# Patient Record
Sex: Male | Born: 2000
Health system: Southern US, Community
[De-identification: ages and names within clinical notes are randomized; demographics above are authoritative.]

## PROBLEM LIST (undated history)

## (undated) DIAGNOSIS — E669 Obesity, unspecified: Secondary | ICD-10-CM

## (undated) DIAGNOSIS — L509 Urticaria, unspecified: Secondary | ICD-10-CM

## (undated) HISTORY — DX: Urticaria, unspecified: L50.9

## (undated) HISTORY — DX: Obesity, unspecified: E66.9

---
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
## 2022-01-05 DIAGNOSIS — F411 Generalized anxiety disorder: Secondary | ICD-10-CM | POA: Diagnosis not present

## 2022-01-12 ENCOUNTER — Ambulatory Visit (INDEPENDENT_AMBULATORY_CARE_PROVIDER_SITE_OTHER): Payer: BC Managed Care – PPO

## 2022-01-12 ENCOUNTER — Encounter: Payer: Self-pay | Admitting: Orthopaedic Surgery

## 2022-01-12 ENCOUNTER — Other Ambulatory Visit: Payer: Self-pay

## 2022-01-12 ENCOUNTER — Ambulatory Visit (INDEPENDENT_AMBULATORY_CARE_PROVIDER_SITE_OTHER): Payer: BC Managed Care – PPO | Admitting: Orthopaedic Surgery

## 2022-01-12 DIAGNOSIS — G8929 Other chronic pain: Secondary | ICD-10-CM

## 2022-01-12 DIAGNOSIS — M542 Cervicalgia: Secondary | ICD-10-CM

## 2022-01-12 DIAGNOSIS — F411 Generalized anxiety disorder: Secondary | ICD-10-CM | POA: Diagnosis not present

## 2022-01-12 DIAGNOSIS — M25511 Pain in right shoulder: Secondary | ICD-10-CM | POA: Diagnosis not present

## 2022-01-12 NOTE — Progress Notes (Signed)
? ?Office Visit Note ?  ?Patient: Joel Holmes           ?Date of Birth: [birth year]           ?MRN: [id number] ?Visit Date: 01/12/2022 ?             ?Requested by: Estrella Myrtle, MD ?[address] ?[place],  [place] [address] ?PCP: Estrella Myrtle, MD ? ? ?Assessment & Plan: ?Visit Diagnoses:  ?1. Chronic right shoulder pain   ?2. Neck pain   ? ? ?Plan: Impression is chronic right shoulder pain.  He has done physical therapy for about 2 to 3 years without significant relief therefore he will need an MR arthrogram to rule out structural normalities.  Follow-up after the MRI. ? ?Follow-Up Instructions: No follow-ups on file.  ? ?Orders:  ?Orders Placed This Encounter  ?Procedures  ? XR Shoulder Right  ? XR Cervical Spine 2 or 3 views  ? MR SHOULDER RIGHT W CONTRAST  ? Arthrogram  ? ?No orders of the defined types were placed in this encounter. ? ? ? ? Procedures: ?No procedures performed ? ? ?Clinical Data: ?No additional findings. ? ? ?Subjective: ?Chief Complaint  ?Patient presents with  ? Right Shoulder - Pain  ? Neck - Pain  ? ? ?HPI ? 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  He comes in for chronic right shoulder pain.  Denies any injuries.  He has pain when working out at [employer]  Overall well-maintained range of motion.  Takes Advil and Tylenol occasionally. ? ?Review of Systems  ?Constitutional: Negative.   ?All other systems reviewed and are negative. ? ? ?Objective: ?Vital Signs: There were no vitals taken for this visit. ? ?Physical Exam ?Vitals and nursing note reviewed.  ?Constitutional:   ?   Appearance: He is well-developed.  ?HENT:  ?   Head: Normocephalic and atraumatic.  ?Eyes:  ?   Pupils: Pupils are equal, round, and reactive to light.  ?Pulmonary:  ?   Effort: Pulmonary effort is normal.  ?Abdominal:  ?   Palpations: Abdomen is soft.  ?Musculoskeletal:     ?   General: Normal range of motion.  ?   Cervical back:  Neck supple.  ?Skin: ?   General: Skin is warm.  ?Neurological:  ?   Mental Status: He is alert and oriented to person, place, and time.  ?Psychiatric:     ?   Behavior: Behavior normal.     ?   Thought Content: Thought content normal.     ?   Judgment: Judgment normal.  ? ? ?Ortho Exam ? ?Examination of right shoulder shows full range of motion.  Pain with loading of the posterior labrum.  Rotator cuff strength is normal.  No pain with impingement testing.  AC joint nontender. ? ?Specialty Comments:  ?No specialty comments available. ? ?Imaging: ?XR Cervical Spine 2 or 3 views ? ?Result Date: 01/12/2022 ?Straightening of cervical spine.  No acute or structural abnormalities. ? ?XR Shoulder Right ? ?Result Date: 01/12/2022 ?No acute or structural abnormalities  ? ? ?PMFS History: ?There are no problems to display for this patient. ? ?Past Medical History:  ?Diagnosis Date  ? Obesity   ? Urticaria   ?  ?Family History  ?Problem Relation Age of Onset  ? Asthma Mother   ? Allergic rhinitis Mother   ? Cancer Maternal Grandmother   ?  neuro endocrine carcinoma  ? Allergic rhinitis Maternal Grandfather   ? Urticaria Maternal Grandfather   ?  ?History reviewed. No pertinent surgical history. ?Social History  ? ?Occupational History  ? Not on file  ?Tobacco Use  ? Smoking status: Never  ? Smokeless tobacco: Never  ?Substance and Sexual Activity  ? Alcohol use: Not on file  ? Drug use: Not on file  ? Sexual activity: Not on file  ? ? ? ? ? ? ?

## 2022-01-19 DIAGNOSIS — F411 Generalized anxiety disorder: Secondary | ICD-10-CM | POA: Diagnosis not present

## 2022-01-20 ENCOUNTER — Other Ambulatory Visit: Payer: BC Managed Care – PPO

## 2022-01-26 ENCOUNTER — Telehealth: Payer: Self-pay | Admitting: Orthopaedic Surgery

## 2022-01-26 DIAGNOSIS — F411 Generalized anxiety disorder: Secondary | ICD-10-CM | POA: Diagnosis not present

## 2022-01-26 NOTE — Telephone Encounter (Signed)
LMOM for pt to sch post MRI after 02/02/22 with XU ?

## 2022-02-02 ENCOUNTER — Ambulatory Visit
Admission: RE | Admit: 2022-02-02 | Discharge: 2022-02-02 | Disposition: A | Discharge status: Home / Self Care | Payer: BC Managed Care – PPO | Source: Ambulatory Visit | Attending: Orthopaedic Surgery | Admitting: Orthopaedic Surgery

## 2022-02-02 DIAGNOSIS — G8929 Other chronic pain: Secondary | ICD-10-CM

## 2022-02-02 DIAGNOSIS — M25511 Pain in right shoulder: Secondary | ICD-10-CM | POA: Diagnosis not present

## 2022-02-02 DIAGNOSIS — F411 Generalized anxiety disorder: Secondary | ICD-10-CM | POA: Diagnosis not present

## 2022-02-02 MED ORDER — IOPAMIDOL (ISOVUE-M 200) INJECTION 41%: 15.0000 mL | Freq: Once | INTRAMUSCULAR | Status: DC

## 2022-02-02 NOTE — Progress Notes (Signed)
Needs appt

## 2022-02-08 NOTE — Progress Notes (Signed)
Called no answer LMOM.

## 2022-02-09 DIAGNOSIS — F411 Generalized anxiety disorder: Secondary | ICD-10-CM | POA: Diagnosis not present

## 2022-02-09 NOTE — Progress Notes (Signed)
Sent a Clinical cytogeneticist message advising him to schedule appt to review MRI ?

## 2022-02-16 DIAGNOSIS — F411 Generalized anxiety disorder: Secondary | ICD-10-CM | POA: Diagnosis not present

## 2022-02-23 DIAGNOSIS — F411 Generalized anxiety disorder: Secondary | ICD-10-CM | POA: Diagnosis not present

## 2022-03-09 ENCOUNTER — Ambulatory Visit (INDEPENDENT_AMBULATORY_CARE_PROVIDER_SITE_OTHER): Payer: BC Managed Care – PPO | Admitting: Orthopaedic Surgery

## 2022-03-09 ENCOUNTER — Encounter: Payer: Self-pay | Admitting: Orthopaedic Surgery

## 2022-03-09 DIAGNOSIS — F411 Generalized anxiety disorder: Secondary | ICD-10-CM | POA: Diagnosis not present

## 2022-03-09 DIAGNOSIS — G8929 Other chronic pain: Secondary | ICD-10-CM | POA: Diagnosis not present

## 2022-03-09 DIAGNOSIS — M25511 Pain in right shoulder: Secondary | ICD-10-CM | POA: Diagnosis not present

## 2022-03-09 NOTE — Progress Notes (Signed)
? ?  Office Visit Note ?  ?Patient: Joel Holmes           ?Date of Birth: 2000/11/30           ?MRN: [id number] ?Visit Date: 03/09/2022 ?             ?Requested by: Estrella Myrtle, MD ?[address] ?[place],  [place] [address] ?PCP: Estrella Myrtle, MD ? ? ?Assessment & Plan: ?Visit Diagnoses:  ?1. Chronic right shoulder pain   ? ? ?Plan: Based on findings I feel that his issues stem from his scapula snapping on the posterior rib cage.  I do not feel that he is experiencing frank dislocations of the glenohumeral joint.  His MR arthrogram is fairly reassuring and is entirely negative.  I would like to send him to outpatient PT for scapular dyskinesia.  If he does not see any improvement from PT may send him to Dr. August Saucer or Steward Drone for second opinion. ? ?Follow-Up Instructions: Return if symptoms worsen or fail to improve.  ? ?Orders:  ?No orders of the defined types were placed in this encounter. ? ?No orders of the defined types were placed in this encounter. ? ? ? ? Procedures: ?No procedures performed ? ? ?Clinical Data: ?No additional findings. ? ? ?Subjective: ?Chief Complaint  ?Patient presents with  ? Right Shoulder - Pain, Follow-up  ? ? ?HPI ? ?Joel Holmes returns today with his mother to discuss right shoulder MRI.  No changes in his symptoms. ? ?Review of Systems ? ? ?Objective: ?Vital Signs: There were no vitals taken for this visit. ? ?Physical Exam ? ?Ortho Exam ? ?Examination of right shoulder shows full active and passive range of motion of the glenohumeral joint.  Negative apprehension.  Strength is intact to manual muscle testing.  Negative sulcus sign.  Able to feel snapping of his scapula on the posterior rib cage. ? ?Specialty Comments:  ?No specialty comments available. ? ?Imaging: ?No results found. ? ? ?PMFS History: ?Patient Active Problem List  ? Diagnosis Date Noted  ? Chronic right shoulder pain 03/09/2022  ? ?Past Medical History:  ?Diagnosis Date  ? Obesity   ? Urticaria   ?  ?Family History   ?Problem Relation Age of Onset  ? Asthma Mother   ? Allergic rhinitis Mother   ? Cancer Maternal Grandmother   ?     neuro endocrine carcinoma  ? Allergic rhinitis Maternal Grandfather   ? Urticaria Maternal Grandfather   ?  ?History reviewed. No pertinent surgical history. ?Social History  ? ?Occupational History  ? Not on file  ?Tobacco Use  ? Smoking status: Never  ? Smokeless tobacco: Never  ?Substance and Sexual Activity  ? Alcohol use: Not on file  ? Drug use: Not on file  ? Sexual activity: Not on file  ? ? ? ? ? ? ?

## 2022-03-16 DIAGNOSIS — F411 Generalized anxiety disorder: Secondary | ICD-10-CM | POA: Diagnosis not present

## 2022-03-30 DIAGNOSIS — F411 Generalized anxiety disorder: Secondary | ICD-10-CM | POA: Diagnosis not present

## 2022-04-06 DIAGNOSIS — F411 Generalized anxiety disorder: Secondary | ICD-10-CM | POA: Diagnosis not present

## 2022-04-13 DIAGNOSIS — F411 Generalized anxiety disorder: Secondary | ICD-10-CM | POA: Diagnosis not present

## 2022-04-20 DIAGNOSIS — F411 Generalized anxiety disorder: Secondary | ICD-10-CM | POA: Diagnosis not present

## 2022-04-28 DIAGNOSIS — F411 Generalized anxiety disorder: Secondary | ICD-10-CM | POA: Diagnosis not present

## 2022-05-12 DIAGNOSIS — F411 Generalized anxiety disorder: Secondary | ICD-10-CM | POA: Diagnosis not present

## 2022-05-19 DIAGNOSIS — F411 Generalized anxiety disorder: Secondary | ICD-10-CM | POA: Diagnosis not present

## 2022-05-26 DIAGNOSIS — F411 Generalized anxiety disorder: Secondary | ICD-10-CM | POA: Diagnosis not present

## 2022-06-01 DIAGNOSIS — F411 Generalized anxiety disorder: Secondary | ICD-10-CM | POA: Diagnosis not present

## 2022-06-07 ENCOUNTER — Ambulatory Visit (INDEPENDENT_AMBULATORY_CARE_PROVIDER_SITE_OTHER): Payer: BC Managed Care – PPO | Admitting: Adult Health

## 2022-06-07 ENCOUNTER — Encounter: Payer: Self-pay | Admitting: Adult Health

## 2022-06-07 DIAGNOSIS — F331 Major depressive disorder, recurrent, moderate: Secondary | ICD-10-CM

## 2022-06-07 DIAGNOSIS — F428 Other obsessive-compulsive disorder: Secondary | ICD-10-CM

## 2022-06-07 DIAGNOSIS — F411 Generalized anxiety disorder: Secondary | ICD-10-CM | POA: Diagnosis not present

## 2022-06-07 NOTE — Progress Notes (Signed)
BRACY PEPPER [id number] Dec 27, 2000 21 y.o.  Subjective:   Patient ID:  Joel Holmes is a 21 y.o. (DOB 2000-11-05) male.  Chief Complaint: No chief complaint on file.   HPI Joel Holmes presents to the office today for follow-up of MDD, GAD, Obsessional thoughts.   Describes mood today as "ok". Pleasant. Less tearfulness. Mood symptoms - reports some depression and anxiety - easier to work through. Feels irritable - "not outwardly".  Decreased worry and over thinking. Stating "I'm managing things". Has taken the Celexa sporadically and has now stopped. Stable interest and motivation. Seeing therapist. Taking medications as prescribed. Energy levels stable. Active, has a regular exercise routine. Enjoys some usual interests and activities. Student. Dating - has a girlfriend. Lives with parents. Brother in [place]. Spending time with family. Appetite adequate. Weight gain - 255 pounds. Sleeps well most nights. Averages 7 to 9 hours.  Focus and concentration stable. Completing tasks. Managing aspects of household.  Working part time. Attends [employer] - rising Sophomore. Denies SI or HI.  Denies AH or VH. Denies self harm. Reports THC use.  Seeing therapist.  Previous medication trials: Denies   Review of Systems:  Review of Systems  Musculoskeletal:  Negative for gait problem.  Neurological:  Negative for tremors.  Psychiatric/Behavioral:         Please refer to HPI    Medications: I have reviewed the patient's current medications.  Current Outpatient Medications  Medication Sig Dispense Refill   cetirizine (ZYRTEC) 10 MG tablet Take 10 mg by mouth daily.     citalopram (CELEXA) 10 MG tablet Take 1 tablet (10 mg total) by mouth daily. 90 tablet 3   DiphenhydrAMINE HCl (BENADRYL ALLERGY PO) Take 1 tablet by mouth as needed.     EPINEPHrine (EPIPEN 2-PAK) 0.3 mg/0.3 mL IJ SOAJ injection Use as directed for life-threatening allergic reaction. 4 Device 1   fexofenadine (ALLEGRA) 30  MG tablet Take 30 mg by mouth 2 (two) times daily.     fluticasone (FLONASE) 50 MCG/ACT nasal spray Place 1 spray into the nose. Use one spray in each nostril one to two times daily.     No current facility-administered medications for this visit.    Medication Side Effects: None  Allergies:  Allergies  Allergen Reactions   Amoxicillin Hives    Severity unknown    Past Medical History:  Diagnosis Date   Obesity    Urticaria     Past Medical History, Surgical history, Social history, and Family history were reviewed and updated as appropriate.   Please see review of systems for further details on the patient's review from today.   Objective:   Physical Exam:  There were no vitals taken for this visit.  Physical Exam Constitutional:      General: He is not in acute distress. Musculoskeletal:        General: No deformity.  Neurological:     Mental Status: He is alert and oriented to person, place, and time.     Coordination: Coordination normal.  Psychiatric:        Attention and Perception: Attention and perception normal. He does not perceive auditory or visual hallucinations.        Mood and Affect: Mood normal. Mood is not anxious or depressed. Affect is not labile, blunt, angry or inappropriate.        Speech: Speech normal.        Behavior: Behavior normal.  Thought Content: Thought content normal. Thought content is not paranoid or delusional. Thought content does not include homicidal or suicidal ideation. Thought content does not include homicidal or suicidal plan.        Cognition and Memory: Cognition and memory normal.        Judgment: Judgment normal.     Comments: Insight intact     Lab Review:  No results found for: "NA", "K", "CL", "CO2", "GLUCOSE", "BUN", "CREATININE", "CALCIUM", "PROT", "ALBUMIN", "AST", "ALT", "ALKPHOS", "BILITOT", "GFRNONAA", "GFRAA"  No results found for: "WBC", "RBC", "HGB", "HCT", "PLT", "MCV", "MCH", "MCHC", "RDW",  "LYMPHSABS", "MONOABS", "EOSABS", "BASOSABS"  No results found for: "POCLITH", "LITHIUM"   No results found for: "PHENYTOIN", "PHENOBARB", "VALPROATE", "CBMZ"   .res Assessment: Plan:    Plan:  PDMP reviewed  D/C Celexa 10mg  daily - patient not taking consistently.   RTC 4 weeks  Patient advised to contact office with any questions, adverse effects, or acute worsening in signs and symptoms. There are no diagnoses linked to this encounter.   Please see After Visit Summary for patient specific instructions.  No future appointments.  No orders of the defined types were placed in this encounter.   -------------------------------

## 2022-06-08 DIAGNOSIS — F411 Generalized anxiety disorder: Secondary | ICD-10-CM | POA: Diagnosis not present

## 2022-06-08 IMAGING — XA DG FLUORO GUIDE NDL PLC/BX
2 series · 2 of 2 positions shown · IV contrast (multihance)
Comparison: none

CLINICAL DATA: Chronic right shoulder pain.

EXAM:
RIGHT SHOULDER INJECTION UNDER FLUOROSCOPY
TECHNIQUE: An appropriate skin entrance site was determined. The site was
marked, prepped with Betadine, draped in the usual sterile fashion,
and infiltrated locally with 1% lidocaine. A 22 gauge spinal needle
was advanced to the superomedial margin of the humeral head under
intermittent fluoroscopy. 1 mL of 1% lidocaine injected easily. A
mixture of 0.1 mL of MultiHance, 15 mL of Isovue-M 200, and 5 mL of
sterile saline was then used to opacify the right shoulder capsule.
A total of 12 mL of this mixture was injected. No immediate
complication.
FLUOROSCOPY:
Radiation Exposure Index (as provided by the fluoroscopic device):
0.30 mGy Kerma

[Series 1: ortho standard · 1 of 1 slices shown (1 of 2)]
[im 1/1]
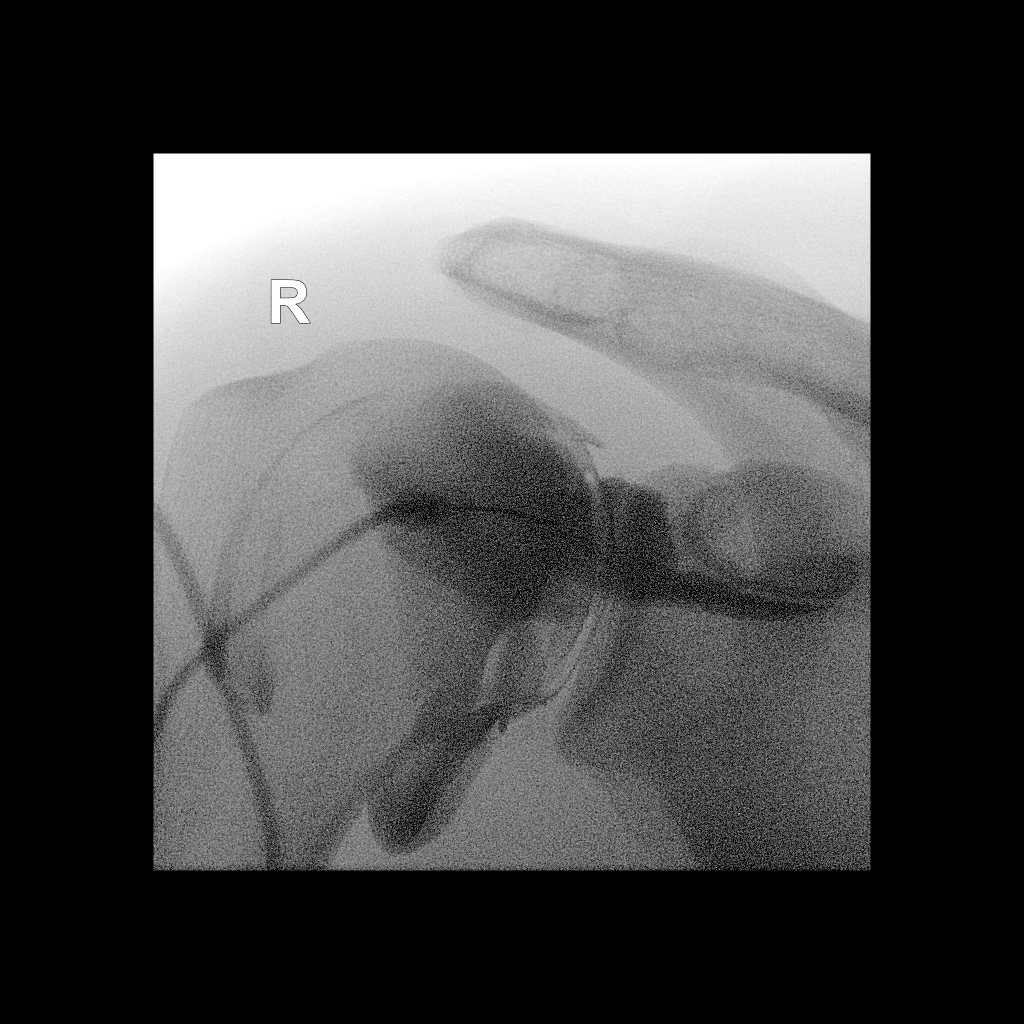

[Series 2: ortho standard · 1 of 1 slices shown (2 of 2)]
[im 1/1]
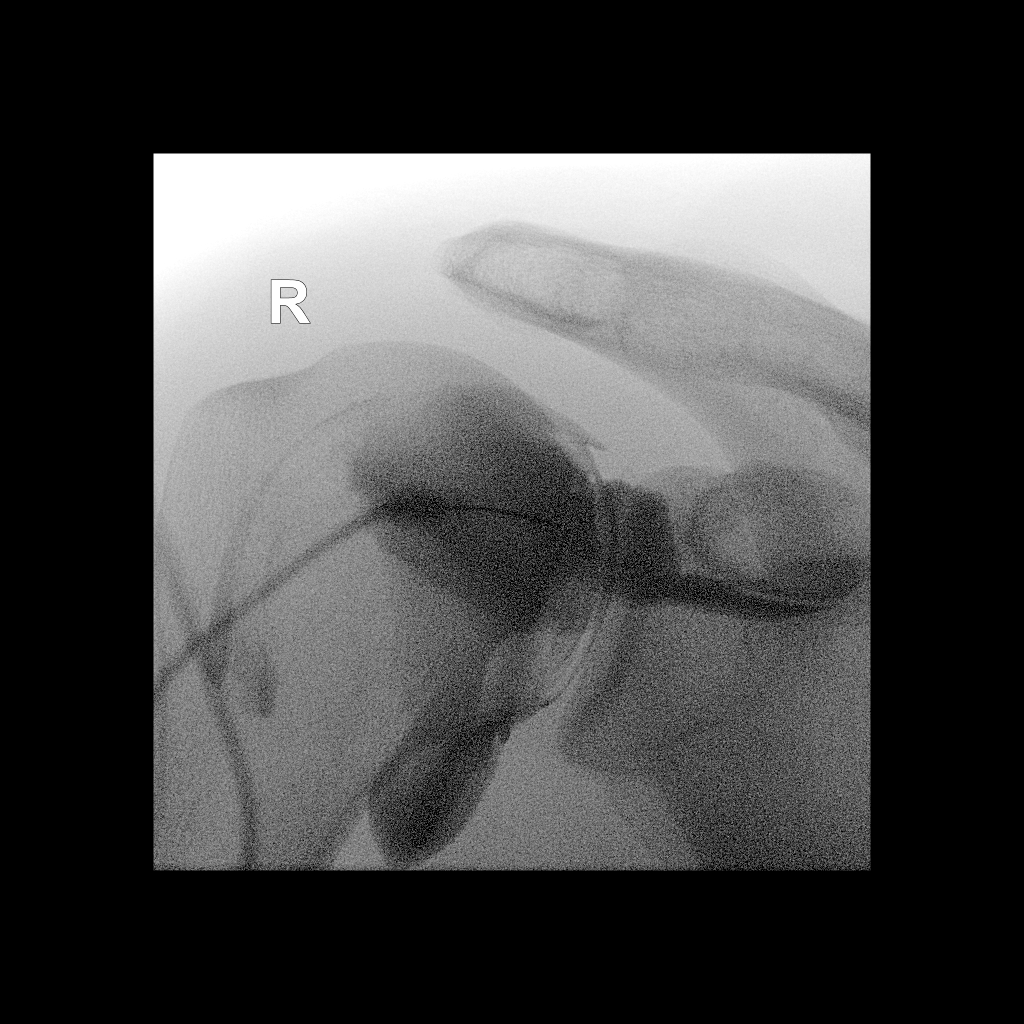

[2 of 2 positions shown; findings below may reference images not displayed]

IMPRESSION: Technically successful right shoulder injection for MRI.

## 2022-06-15 DIAGNOSIS — F411 Generalized anxiety disorder: Secondary | ICD-10-CM | POA: Diagnosis not present

## 2022-06-29 DIAGNOSIS — F411 Generalized anxiety disorder: Secondary | ICD-10-CM | POA: Diagnosis not present

## 2022-07-06 ENCOUNTER — Ambulatory Visit: Payer: BC Managed Care – PPO | Admitting: Adult Health

## 2022-07-06 DIAGNOSIS — F411 Generalized anxiety disorder: Secondary | ICD-10-CM | POA: Diagnosis not present

## 2022-07-13 DIAGNOSIS — F411 Generalized anxiety disorder: Secondary | ICD-10-CM | POA: Diagnosis not present

## 2022-07-20 DIAGNOSIS — F411 Generalized anxiety disorder: Secondary | ICD-10-CM | POA: Diagnosis not present

## 2022-07-27 DIAGNOSIS — F411 Generalized anxiety disorder: Secondary | ICD-10-CM | POA: Diagnosis not present

## 2022-08-09 ENCOUNTER — Telehealth: Payer: Self-pay | Admitting: Adult Health

## 2022-08-09 DIAGNOSIS — F411 Generalized anxiety disorder: Secondary | ICD-10-CM

## 2022-08-09 NOTE — Telephone Encounter (Signed)
Patient's mom Sharyn Lull lvm at 11:38 stating that Joel Holmes was seen about a month ago. He takes Citalopram but at last appt decided to stop taking and told to check in a month. He would like to start taking again. Mom wanted to know if he needed to make an appt for this as he will be home from school today and tomorrow. Last seen 8/7. Pls rtc is needed [phone]. Pharmacy [facility] [place]

## 2022-08-09 NOTE — Telephone Encounter (Signed)
Patient D/C's citalopram, had not been taking it consistently. Mom lm that he would like to restart it. Ok to send Rx?

## 2022-08-09 NOTE — Telephone Encounter (Signed)
Call and confirm with patient - ok to restart if Tyaire agrees - have him taper back on.

## 2022-08-09 NOTE — Telephone Encounter (Signed)
Patient said he had more stressors now and he needed some help in trying to deal with them is why he wanted to restart citalopram. Stressed to him that he needs to be consistent with taking it and he said he would.

## 2022-08-09 NOTE — Telephone Encounter (Signed)
Has an appt with Joel Holmes tomorrow. Will defer Rx until that visit.

## 2022-08-10 ENCOUNTER — Encounter: Payer: Self-pay | Admitting: Adult Health

## 2022-08-10 ENCOUNTER — Ambulatory Visit: Payer: BC Managed Care – PPO | Admitting: Adult Health

## 2022-08-10 DIAGNOSIS — F428 Other obsessive-compulsive disorder: Secondary | ICD-10-CM | POA: Diagnosis not present

## 2022-08-10 DIAGNOSIS — F331 Major depressive disorder, recurrent, moderate: Secondary | ICD-10-CM | POA: Diagnosis not present

## 2022-08-10 DIAGNOSIS — F411 Generalized anxiety disorder: Secondary | ICD-10-CM

## 2022-08-10 MED ORDER — CITALOPRAM HYDROBROMIDE 10 MG PO TABS: 10.0000 mg | ORAL_TABLET | Freq: Every day | ORAL | 3 refills | Status: DC

## 2022-08-10 NOTE — Progress Notes (Signed)
Joel Holmes [id number] September 03, 2001 21 y.o.  Subjective:   Patient ID:  Joel Holmes is a 21 y.o. (DOB 08-19-2001) male.  Chief Complaint: No chief complaint on file.   HPI Joel Holmes presents to the office today for follow-up of MDD, GAD, Obsessional thoughts.   Describes mood today as "ok". Pleasant. Tearful at times. Mood symptoms - reports depression, irritability and anxiety. Reports worry and rumination. Mood is variable. Stating "I'm not doing as well as I was". Stopped the Celexa approximately 2 months ago because he felt like he was doing well without it until recently. Stating "I didn't know what to expect". Would like to restart the Celexa 10mg  daily. Variable interest and motivation. Seeing therapist. Taking medications as prescribed. Energy levels variable. Active, has a regular exercise routine. Enjoys some usual interests and activities. Student. Dating - has a girlfriend. Lives with parents. Brother in [place]. Spending time with family. Appetite adequate. Weight gain - 275 pounds. Sleeps well most nights. Averages 4 to 8 hours.  Focus and concentration difficulties. Completing tasks. Managing aspects of household. Attends [employer] - Sophomore. Denies SI or HI. Reports passive thoughts since stopping the Celexa. Denies AH or VH. Denies self harm. Reports THC use - smoking less. Decreased alcohol use.  Seeing therapist.  Previous medication trials: Denies      Review of Systems:  Review of Systems  Musculoskeletal:  Negative for gait problem.  Neurological:  Negative for tremors.  Psychiatric/Behavioral:         Please refer to HPI    Medications: I have reviewed the patient's current medications.  Current Outpatient Medications  Medication Sig Dispense Refill   cetirizine (ZYRTEC) 10 MG tablet Take 10 mg by mouth daily.     citalopram (CELEXA) 10 MG tablet Take 1 tablet (10 mg total) by mouth daily. 90 tablet 3   DiphenhydrAMINE HCl (BENADRYL ALLERGY PO)  Take 1 tablet by mouth as needed.     EPINEPHrine (EPIPEN 2-PAK) 0.3 mg/0.3 mL IJ SOAJ injection Use as directed for life-threatening allergic reaction. 4 Device 1   fexofenadine (ALLEGRA) 30 MG tablet Take 30 mg by mouth 2 (two) times daily.     fluticasone (FLONASE) 50 MCG/ACT nasal spray Place 1 spray into the nose. Use one spray in each nostril one to two times daily.     No current facility-administered medications for this visit.    Medication Side Effects: None  Allergies:  Allergies  Allergen Reactions   Amoxicillin Hives    Severity unknown    Past Medical History:  Diagnosis Date   Obesity    Urticaria     Past Medical History, Surgical history, Social history, and Family history were reviewed and updated as appropriate.   Please see review of systems for further details on the patient's review from today.   Objective:   Physical Exam:  There were no vitals taken for this visit.  Physical Exam Constitutional:      General: He is not in acute distress. Musculoskeletal:        General: No deformity.  Neurological:     Mental Status: He is alert and oriented to person, place, and time.     Coordination: Coordination normal.  Psychiatric:        Attention and Perception: Attention and perception normal. He does not perceive auditory or visual hallucinations.        Mood and Affect: Mood normal. Mood is not anxious or depressed. Affect is not  labile, blunt, angry or inappropriate.        Speech: Speech normal.        Behavior: Behavior normal.        Thought Content: Thought content normal. Thought content is not paranoid or delusional. Thought content does not include homicidal or suicidal ideation. Thought content does not include homicidal or suicidal plan.        Cognition and Memory: Cognition and memory normal.        Judgment: Judgment normal.     Comments: Insight intact     Lab Review:  No results found for: "NA", "K", "CL", "CO2", "GLUCOSE", "BUN",  "CREATININE", "CALCIUM", "PROT", "ALBUMIN", "AST", "ALT", "ALKPHOS", "BILITOT", "GFRNONAA", "GFRAA"  No results found for: "WBC", "RBC", "HGB", "HCT", "PLT", "MCV", "MCH", "MCHC", "RDW", "LYMPHSABS", "MONOABS", "EOSABS", "BASOSABS"  No results found for: "POCLITH", "LITHIUM"   No results found for: "PHENYTOIN", "PHENOBARB", "VALPROATE", "CBMZ"   .res Assessment: Plan:    Plan:  PDMP reviewed  Restart Celexa 10mg  daily - patient not taking consistently.   RTC 4 weeks  Patient advised to contact office with any questions, adverse effects, or acute worsening in signs and symptoms.   Time spent with patient was 25 minutes. Greater than 50% of face to face time with patient was spent on counseling and coordination of care.   Diagnoses and all orders for this visit:  Major depressive disorder, recurrent episode, moderate (HCC)  Generalized anxiety disorder -     citalopram (CELEXA) 10 MG tablet; Take 1 tablet (10 mg total) by mouth daily.  Obsessional thoughts     Please see After Visit Summary for patient specific instructions.  No future appointments.   No orders of the defined types were placed in this encounter.   -------------------------------

## 2022-08-13 ENCOUNTER — Telehealth: Payer: Self-pay | Admitting: Adult Health

## 2022-08-13 NOTE — Telephone Encounter (Signed)
I don't see in the note if you discussed this letter or not.Please let me know if not I will call to get more info but he was seen on 10/10

## 2022-08-13 NOTE — Telephone Encounter (Signed)
Joel Holmes lvm requesting documentation and/or letter on his treatment to justify why he had to drop a class. 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  Patient last seen 10/10, with a follow up scheduled 08/31/22  Contact information [phone]

## 2022-08-13 NOTE — Telephone Encounter (Signed)
Called and spoke with patient - he will get information to [place] for the letter.

## 2022-08-13 NOTE — Telephone Encounter (Signed)
Spoke to pt's mom at 1:11p.  She is on DPR.  She said she would like to come pick up the letter.  Pls call her at [phone] when it's ready.   Next appt 10/31

## 2022-08-16 NOTE — Telephone Encounter (Signed)
Called and spoke with mother.

## 2022-08-16 NOTE — Telephone Encounter (Signed)
Patient's mother lvm for an update on the letter needed. States they have a deadline and wants to know when this will be done. Ph: [phone]

## 2022-08-16 NOTE — Telephone Encounter (Signed)
See message regarding letter allowing him to drop a class. I don't know if someone is already working on this.

## 2022-08-16 NOTE — Telephone Encounter (Signed)
FYI.Did he get the info you needed sent to you?

## 2022-08-16 NOTE — Telephone Encounter (Signed)
Pt LVM @ 1:19p.  He said he left a message earlier today, but wasn't sure if he advised Barnett Applebaum that needs the letter by Thurs.

## 2022-08-17 DIAGNOSIS — F411 Generalized anxiety disorder: Secondary | ICD-10-CM | POA: Diagnosis not present

## 2022-08-24 DIAGNOSIS — F411 Generalized anxiety disorder: Secondary | ICD-10-CM | POA: Diagnosis not present

## 2022-08-31 ENCOUNTER — Encounter: Payer: Self-pay | Admitting: Adult Health

## 2022-08-31 ENCOUNTER — Telehealth (INDEPENDENT_AMBULATORY_CARE_PROVIDER_SITE_OTHER): Payer: BC Managed Care – PPO | Admitting: Adult Health

## 2022-08-31 DIAGNOSIS — F428 Other obsessive-compulsive disorder: Secondary | ICD-10-CM

## 2022-08-31 DIAGNOSIS — F331 Major depressive disorder, recurrent, moderate: Secondary | ICD-10-CM | POA: Diagnosis not present

## 2022-08-31 DIAGNOSIS — F411 Generalized anxiety disorder: Secondary | ICD-10-CM

## 2022-08-31 NOTE — Progress Notes (Signed)
Joel Holmes [id number] 27-Sep-2001 21 y.o.  Virtual Visit via Video Note  I connected with pt @ on 08/31/22 at 10:40 AM EDT by a video enabled telemedicine application and verified that I am speaking with the correct person using two identifiers.   I discussed the limitations of evaluation and management by telemedicine and the availability of in person appointments. The patient expressed understanding and agreed to proceed.  I discussed the assessment and treatment plan with the patient. The patient was provided an opportunity to ask questions and all were answered. The patient agreed with the plan and demonstrated an understanding of the instructions.   The patient was advised to call back or seek an in-person evaluation if the symptoms worsen or if the condition fails to improve as anticipated.  I provided 10 minutes of non-face-to-face time during this encounter.  The patient was located at home.  The provider was located at [facility].   Dorothyann Gibbs, NP   Subjective:   Patient ID:  Joel Holmes is a 21 y.o. (DOB 2001/05/20) male.  Chief Complaint: No chief complaint on file.   HPI Joel Holmes presents for follow-up of MDD, GAD and Obsessional thoughts.   Describes mood today as "ok". Pleasant. Tearful at times. Mood symptoms - reports decreased depression, irritability and anxiety. Reports decreased worry and rumination. Mood is improved. Stating "I'm doing better". Has restarted the Celexa at 10mg  and feels it has helped with mood instability. Improved interest and motivation. Seeing therapist. Taking medications as prescribed. Energy levels improved. Active, has a regular exercise routine. Enjoys some usual interests and activities. Student. Dating - has a girlfriend. Lives with parents. Brother in . Spending time with family. Appetite adequate. Weight gain - 275 pounds. Sleeps well most nights. Averages 8 to 9 hours.  Focus and concentration has  improved. Completing tasks. Managing aspects of household. Attends [employer]. Denies SI or HI. Denies AH or VH. Denies self harm. Reports THC use - smoking less. Decreased alcohol use.  Seeing therapist.  Previous medication trials: Denies   Review of Systems:  Review of Systems  Musculoskeletal:  Negative for gait problem.  Neurological:  Negative for tremors.  Psychiatric/Behavioral:         Please refer to HPI    Medications: I have reviewed the patient's current medications.  Current Outpatient Medications  Medication Sig Dispense Refill   cetirizine (ZYRTEC) 10 MG tablet Take 10 mg by mouth daily.     citalopram (CELEXA) 10 MG tablet Take 1 tablet (10 mg total) by mouth daily. 90 tablet 3   DiphenhydrAMINE HCl (BENADRYL ALLERGY PO) Take 1 tablet by mouth as needed.     EPINEPHrine (EPIPEN 2-PAK) 0.3 mg/0.3 mL IJ SOAJ injection Use as directed for life-threatening allergic reaction. 4 Device 1   fexofenadine (ALLEGRA) 30 MG tablet Take 30 mg by mouth 2 (two) times daily.     fluticasone (FLONASE) 50 MCG/ACT nasal spray Place 1 spray into the nose. Use one spray in each nostril one to two times daily.     No current facility-administered medications for this visit.    Medication Side Effects: None  Allergies:  Allergies  Allergen Reactions   Amoxicillin Hives    Severity unknown    Past Medical History:  Diagnosis Date   Obesity    Urticaria     Family History  Problem Relation Age of Onset   Asthma Mother    Allergic rhinitis Mother  Cancer Maternal Grandmother        neuro endocrine carcinoma   Allergic rhinitis Maternal Grandfather    Urticaria Maternal Grandfather     Social History   Socioeconomic History   Marital status: Single    Spouse name: Not on file   Number of children: Not on file   Years of education: Not on file   Highest education level: Not on file  Occupational History   Not on file  Tobacco Use   Smoking status:  Never   Smokeless tobacco: Never  Substance and Sexual Activity   Alcohol use: Not on file   Drug use: Not on file   Sexual activity: Not on file  Other Topics Concern   Not on file  Social History Narrative   Not on file   Social Determinants of Health   Financial Resource Strain: Not on file  Food Insecurity: Not on file  Transportation Needs: Not on file  Physical Activity: Not on file  Stress: Not on file  Social Connections: Not on file  Intimate Partner Violence: Not on file    Past Medical History, Surgical history, Social history, and Family history were reviewed and updated as appropriate.   Please see review of systems for further details on the patient's review from today.   Objective:   Physical Exam:  There were no vitals taken for this visit.  Physical Exam Constitutional:      General: He is not in acute distress. Musculoskeletal:        General: No deformity.  Neurological:     Mental Status: He is alert and oriented to person, place, and time.     Coordination: Coordination normal.  Psychiatric:        Attention and Perception: Attention and perception normal. He does not perceive auditory or visual hallucinations.        Mood and Affect: Mood normal. Mood is not anxious or depressed. Affect is not labile, blunt, angry or inappropriate.        Speech: Speech normal.        Behavior: Behavior normal.        Thought Content: Thought content normal. Thought content is not paranoid or delusional. Thought content does not include homicidal or suicidal ideation. Thought content does not include homicidal or suicidal plan.        Cognition and Memory: Cognition and memory normal.        Judgment: Judgment normal.     Comments: Insight intact     Lab Review:  No results found for: "NA", "K", "CL", "CO2", "GLUCOSE", "BUN", "CREATININE", "CALCIUM", "PROT", "ALBUMIN", "AST", "ALT", "ALKPHOS", "BILITOT", "GFRNONAA", "GFRAA"  No results found for: "WBC",  "RBC", "HGB", "HCT", "PLT", "MCV", "MCH", "MCHC", "RDW", "LYMPHSABS", "MONOABS", "EOSABS", "BASOSABS"  No results found for: "POCLITH", "LITHIUM"   No results found for: "PHENYTOIN", "PHENOBARB", "VALPROATE", "CBMZ"   .res Assessment: Plan:    Plan:  PDMP reviewed  Continue Celexa 10mg  daily - patient not taking consistently.  Discussed ADD - patient to take a screener and call with results.   RTC 4 weeks  Patient advised to contact office with any questions, adverse effects, or acute worsening in signs and symptoms.  Time spent with patient was 25 minutes. Greater than 50% of face to face time with patient was spent on counseling and coordination of care.    Diagnoses and all orders for this visit:  Obsessional thoughts  Major depressive disorder, recurrent episode, moderate (HCC)  Generalized anxiety  disorder     Please see After Visit Summary for patient specific instructions.  No future appointments.  No orders of the defined types were placed in this encounter.     -------------------------------

## 2022-09-07 DIAGNOSIS — F411 Generalized anxiety disorder: Secondary | ICD-10-CM | POA: Diagnosis not present

## 2022-09-14 DIAGNOSIS — F411 Generalized anxiety disorder: Secondary | ICD-10-CM | POA: Diagnosis not present

## 2022-09-21 DIAGNOSIS — F411 Generalized anxiety disorder: Secondary | ICD-10-CM | POA: Diagnosis not present

## 2022-09-28 DIAGNOSIS — F411 Generalized anxiety disorder: Secondary | ICD-10-CM | POA: Diagnosis not present

## 2022-10-05 DIAGNOSIS — F411 Generalized anxiety disorder: Secondary | ICD-10-CM | POA: Diagnosis not present

## 2022-10-19 DIAGNOSIS — F411 Generalized anxiety disorder: Secondary | ICD-10-CM | POA: Diagnosis not present

## 2022-11-02 DIAGNOSIS — F411 Generalized anxiety disorder: Secondary | ICD-10-CM | POA: Diagnosis not present

## 2022-11-04 DIAGNOSIS — H5203 Hypermetropia, bilateral: Secondary | ICD-10-CM | POA: Diagnosis not present

## 2022-11-04 DIAGNOSIS — T1512XA Foreign body in conjunctival sac, left eye, initial encounter: Secondary | ICD-10-CM | POA: Diagnosis not present

## 2022-11-04 DIAGNOSIS — H1045 Other chronic allergic conjunctivitis: Secondary | ICD-10-CM | POA: Diagnosis not present

## 2022-11-04 DIAGNOSIS — T1511XA Foreign body in conjunctival sac, right eye, initial encounter: Secondary | ICD-10-CM | POA: Diagnosis not present

## 2022-11-09 DIAGNOSIS — F411 Generalized anxiety disorder: Secondary | ICD-10-CM | POA: Diagnosis not present

## 2022-11-15 DIAGNOSIS — F411 Generalized anxiety disorder: Secondary | ICD-10-CM | POA: Diagnosis not present

## 2022-11-22 DIAGNOSIS — F411 Generalized anxiety disorder: Secondary | ICD-10-CM | POA: Diagnosis not present

## 2022-11-30 DIAGNOSIS — F411 Generalized anxiety disorder: Secondary | ICD-10-CM | POA: Diagnosis not present

## 2022-12-06 DIAGNOSIS — F411 Generalized anxiety disorder: Secondary | ICD-10-CM | POA: Diagnosis not present

## 2022-12-16 DIAGNOSIS — F411 Generalized anxiety disorder: Secondary | ICD-10-CM | POA: Diagnosis not present

## 2022-12-20 DIAGNOSIS — F411 Generalized anxiety disorder: Secondary | ICD-10-CM | POA: Diagnosis not present

## 2022-12-27 DIAGNOSIS — Z20822 Contact with and (suspected) exposure to covid-19: Secondary | ICD-10-CM | POA: Diagnosis not present

## 2022-12-27 DIAGNOSIS — J029 Acute pharyngitis, unspecified: Secondary | ICD-10-CM | POA: Diagnosis not present

## 2022-12-31 DIAGNOSIS — F411 Generalized anxiety disorder: Secondary | ICD-10-CM | POA: Diagnosis not present

## 2023-01-06 DIAGNOSIS — F411 Generalized anxiety disorder: Secondary | ICD-10-CM | POA: Diagnosis not present

## 2023-01-12 DIAGNOSIS — R350 Frequency of micturition: Secondary | ICD-10-CM | POA: Diagnosis not present

## 2023-01-12 DIAGNOSIS — Z125 Encounter for screening for malignant neoplasm of prostate: Secondary | ICD-10-CM | POA: Diagnosis not present

## 2023-01-12 DIAGNOSIS — N3944 Nocturnal enuresis: Secondary | ICD-10-CM | POA: Diagnosis not present

## 2023-01-13 DIAGNOSIS — F411 Generalized anxiety disorder: Secondary | ICD-10-CM | POA: Diagnosis not present

## 2023-01-20 DIAGNOSIS — F411 Generalized anxiety disorder: Secondary | ICD-10-CM | POA: Diagnosis not present

## 2023-01-27 DIAGNOSIS — F411 Generalized anxiety disorder: Secondary | ICD-10-CM | POA: Diagnosis not present

## 2023-02-03 DIAGNOSIS — F411 Generalized anxiety disorder: Secondary | ICD-10-CM | POA: Diagnosis not present

## 2023-02-10 DIAGNOSIS — F411 Generalized anxiety disorder: Secondary | ICD-10-CM | POA: Diagnosis not present

## 2023-02-17 DIAGNOSIS — F411 Generalized anxiety disorder: Secondary | ICD-10-CM | POA: Diagnosis not present

## 2023-02-24 DIAGNOSIS — F411 Generalized anxiety disorder: Secondary | ICD-10-CM | POA: Diagnosis not present

## 2023-03-03 DIAGNOSIS — F411 Generalized anxiety disorder: Secondary | ICD-10-CM | POA: Diagnosis not present

## 2023-03-08 DIAGNOSIS — R351 Nocturia: Secondary | ICD-10-CM | POA: Diagnosis not present

## 2023-03-08 DIAGNOSIS — N3944 Nocturnal enuresis: Secondary | ICD-10-CM | POA: Diagnosis not present

## 2023-03-10 DIAGNOSIS — F411 Generalized anxiety disorder: Secondary | ICD-10-CM | POA: Diagnosis not present

## 2023-03-17 DIAGNOSIS — F411 Generalized anxiety disorder: Secondary | ICD-10-CM | POA: Diagnosis not present

## 2023-03-24 DIAGNOSIS — F411 Generalized anxiety disorder: Secondary | ICD-10-CM | POA: Diagnosis not present

## 2023-03-31 DIAGNOSIS — F411 Generalized anxiety disorder: Secondary | ICD-10-CM | POA: Diagnosis not present

## 2023-04-07 DIAGNOSIS — F411 Generalized anxiety disorder: Secondary | ICD-10-CM | POA: Diagnosis not present

## 2023-04-14 DIAGNOSIS — F411 Generalized anxiety disorder: Secondary | ICD-10-CM | POA: Diagnosis not present

## 2023-04-21 DIAGNOSIS — F411 Generalized anxiety disorder: Secondary | ICD-10-CM | POA: Diagnosis not present

## 2023-04-27 DIAGNOSIS — F419 Anxiety disorder, unspecified: Secondary | ICD-10-CM | POA: Diagnosis not present

## 2023-04-27 DIAGNOSIS — Z79899 Other long term (current) drug therapy: Secondary | ICD-10-CM | POA: Diagnosis not present

## 2023-04-27 DIAGNOSIS — N3944 Nocturnal enuresis: Secondary | ICD-10-CM | POA: Diagnosis not present

## 2023-04-27 DIAGNOSIS — R41840 Attention and concentration deficit: Secondary | ICD-10-CM | POA: Diagnosis not present

## 2023-04-27 DIAGNOSIS — F331 Major depressive disorder, recurrent, moderate: Secondary | ICD-10-CM | POA: Diagnosis not present

## 2023-04-28 DIAGNOSIS — F411 Generalized anxiety disorder: Secondary | ICD-10-CM | POA: Diagnosis not present

## 2023-05-10 DIAGNOSIS — G4721 Circadian rhythm sleep disorder, delayed sleep phase type: Secondary | ICD-10-CM | POA: Diagnosis not present

## 2023-05-10 DIAGNOSIS — R32 Unspecified urinary incontinence: Secondary | ICD-10-CM | POA: Diagnosis not present

## 2023-05-12 DIAGNOSIS — F411 Generalized anxiety disorder: Secondary | ICD-10-CM | POA: Diagnosis not present

## 2023-05-17 ENCOUNTER — Ambulatory Visit: Payer: BC Managed Care – PPO | Admitting: Adult Health

## 2023-05-17 ENCOUNTER — Encounter: Payer: Self-pay | Admitting: Adult Health

## 2023-05-17 DIAGNOSIS — F411 Generalized anxiety disorder: Secondary | ICD-10-CM

## 2023-05-17 DIAGNOSIS — F428 Other obsessive-compulsive disorder: Secondary | ICD-10-CM | POA: Diagnosis not present

## 2023-05-17 DIAGNOSIS — F331 Major depressive disorder, recurrent, moderate: Secondary | ICD-10-CM

## 2023-05-17 MED ORDER — CITALOPRAM HYDROBROMIDE 10 MG PO TABS
10.0000 mg | ORAL_TABLET | Freq: Every day | ORAL | 3 refills | Status: AC
Start: 2023-05-17 — End: ?

## 2023-05-17 NOTE — Progress Notes (Signed)
Joel Holmes [id number] 01/15/2001 22 y.o.  Subjective:   Patient ID:  Joel Holmes is a 22 y.o. (DOB [birth year]) male.  Chief Complaint: No chief complaint on file.   HPI KILIAN SCHWARTZ presents to the office today for follow-up of MDD, GAD and Obsessional thoughts.   Describes mood today as "ok". Pleasant. Tearful at times. Mood symptoms - reports decreased depression,  anxiety, and irritability. Reports worry and rumination - "quite a bit". Denies panic attacks. Mood is stable. Stating "I'm doing alright". Feels like the Celexa at 10mg  works well. Improved interest and motivation. Seeing therapist. Taking medications as prescribed. Energy levels "ok". Active, has a regular exercise routine. Enjoys some usual interests and activities. Student. Dating. Lives with parents. Brother in [place]. Spending time with family. Appetite adequate. Weight gain - 285 pounds. Sleeps well most nights. Averages 8 to 9 hours.  Focus and concentration has improved. Completing tasks. Managing aspects of household. 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 Denies SI or HI. Denies AH or VH. Denies self harm. Reports THC use - smoking less. Decreased alcohol use.  Seeing therapist.  Previous medication trials: Denies  Review of Systems:  Review of Systems  Musculoskeletal:  Negative for gait problem.  Neurological:  Negative for tremors.  Psychiatric/Behavioral:         Please refer to HPI    Medications: I have reviewed the patient's current medications.  Current Outpatient Medications  Medication Sig Dispense Refill   cetirizine (ZYRTEC) 10 MG tablet Take 10 mg by mouth daily.     citalopram (CELEXA) 10 MG tablet Take 1 tablet (10 mg total) by mouth daily. 90 tablet 3   DiphenhydrAMINE HCl (BENADRYL ALLERGY PO) Take 1 tablet by mouth as needed.     EPINEPHrine (EPIPEN 2-PAK) 0.3 mg/0.3 mL IJ SOAJ injection Use as directed for life-threatening allergic reaction. 4 Device 1   fexofenadine (ALLEGRA) 30 MG tablet  Take 30 mg by mouth 2 (two) times daily.     fluticasone (FLONASE) 50 MCG/ACT nasal spray Place 1 spray into the nose. Use one spray in each nostril one to two times daily.     No current facility-administered medications for this visit.    Medication Side Effects: None  Allergies:  Allergies  Allergen Reactions   Amoxicillin Hives    Severity unknown    Past Medical History:  Diagnosis Date   Obesity    Urticaria     Past Medical History, Surgical history, Social history, and Family history were reviewed and updated as appropriate.   Please see review of systems for further details on the patient's review from today.   Objective:   Physical Exam:  There were no vitals taken for this visit.  Physical Exam Constitutional:      General: He is not in acute distress. Musculoskeletal:        General: No deformity.  Neurological:     Mental Status: He is alert and oriented to person, place, and time.     Coordination: Coordination normal.  Psychiatric:        Attention and Perception: Attention and perception normal. He does not perceive auditory or visual hallucinations.        Mood and Affect: Mood normal. Mood is not anxious or depressed. Affect is not labile, blunt, angry or inappropriate.        Speech: Speech normal.        Behavior: Behavior normal.        Thought Content: Thought  content normal. Thought content is not paranoid or delusional. Thought content does not include homicidal or suicidal ideation. Thought content does not include homicidal or suicidal plan.        Cognition and Memory: Cognition and memory normal.        Judgment: Judgment normal.     Comments: Insight intact     Lab Review:  No results found for: "NA", "K", "CL", "CO2", "GLUCOSE", "BUN", "CREATININE", "CALCIUM", "PROT", "ALBUMIN", "AST", "ALT", "ALKPHOS", "BILITOT", "GFRNONAA", "GFRAA"  No results found for: "WBC", "RBC", "HGB", "HCT", "PLT", "MCV", "MCH", "MCHC", "RDW", "LYMPHSABS",  "MONOABS", "EOSABS", "BASOSABS"  No results found for: "POCLITH", "LITHIUM"   No results found for: "PHENYTOIN", "PHENOBARB", "VALPROATE", "CBMZ"   .res Assessment: Plan:    Plan:  PDMP reviewed  Continue Celexa 10mg  daily - patient working on taking consistently.  Discussed ADD - tested at [facility]  Upcoming sleep apnea testing   RTC 6 months  Patient advised to contact office with any questions, adverse effects, or acute worsening in signs and symptoms.  Time spent with patient was 15 minutes. Greater than 50% of face to face time with patient was spent on counseling and coordination of care.    There are no diagnoses linked to this encounter.   Please see After Visit Summary for patient specific instructions.  Future Appointments  Date Time Provider Department Center  05/17/2023  5:00 PM Marice Angelino, Thereasa Solo, NP [facility] None    No orders of the defined types were placed in this encounter.   -------------------------------

## 2023-05-18 DIAGNOSIS — E669 Obesity, unspecified: Secondary | ICD-10-CM | POA: Diagnosis not present

## 2023-05-18 DIAGNOSIS — G4733 Obstructive sleep apnea (adult) (pediatric): Secondary | ICD-10-CM | POA: Diagnosis not present

## 2023-05-19 DIAGNOSIS — F411 Generalized anxiety disorder: Secondary | ICD-10-CM | POA: Diagnosis not present

## 2023-05-26 DIAGNOSIS — F411 Generalized anxiety disorder: Secondary | ICD-10-CM | POA: Diagnosis not present

## 2023-06-02 DIAGNOSIS — F411 Generalized anxiety disorder: Secondary | ICD-10-CM | POA: Diagnosis not present

## 2023-07-01 DIAGNOSIS — F411 Generalized anxiety disorder: Secondary | ICD-10-CM | POA: Diagnosis not present

## 2023-07-08 DIAGNOSIS — F411 Generalized anxiety disorder: Secondary | ICD-10-CM | POA: Diagnosis not present

## 2023-07-15 DIAGNOSIS — F411 Generalized anxiety disorder: Secondary | ICD-10-CM | POA: Diagnosis not present

## 2023-08-01 ENCOUNTER — Ambulatory Visit (INDEPENDENT_AMBULATORY_CARE_PROVIDER_SITE_OTHER): Payer: Self-pay | Admitting: Adult Health

## 2023-08-01 DIAGNOSIS — Z0389 Encounter for observation for other suspected diseases and conditions ruled out: Secondary | ICD-10-CM

## 2023-08-01 NOTE — Progress Notes (Signed)
Patient no show appointment. ? ?

## 2023-08-02 ENCOUNTER — Encounter: Payer: Self-pay | Admitting: Adult Health

## 2023-08-02 ENCOUNTER — Telehealth: Payer: BC Managed Care – PPO | Admitting: Adult Health

## 2023-08-02 DIAGNOSIS — F331 Major depressive disorder, recurrent, moderate: Secondary | ICD-10-CM | POA: Diagnosis not present

## 2023-08-02 DIAGNOSIS — F428 Other obsessive-compulsive disorder: Secondary | ICD-10-CM

## 2023-08-02 DIAGNOSIS — F411 Generalized anxiety disorder: Secondary | ICD-10-CM

## 2023-08-02 NOTE — Progress Notes (Signed)
Joel Holmes [id number] 09/22/2001 22 y.o.  Virtual Visit via Video Note  I connected with pt @ on 08/02/23 at  4:40 PM EDT by a video enabled telemedicine application and verified that I am speaking with the correct person using two identifiers.   I discussed the limitations of evaluation and management by telemedicine and the availability of in person appointments. The patient expressed understanding and agreed to proceed.  I discussed the assessment and treatment plan with the patient. The patient was provided an opportunity to ask questions and all were answered. The patient agreed with the plan and demonstrated an understanding of the instructions.   The patient was advised to call back or seek an in-person evaluation if the symptoms worsen or if the condition fails to improve as anticipated.  I provided 20 minutes of non-face-to-face time during this encounter.  The patient was located at home.  The provider was located at [facility].   Dorothyann Gibbs, NP   Subjective:   Patient ID:  Joel Holmes is a 22 y.o. (DOB 08-30-2001) male.  Chief Complaint: No chief complaint on file.   HPI Joel Holmes presents for follow-up of MDD, GAD and Obsessional thoughts.   Describes mood today as "ok". Pleasant. Tearful at times. Mood symptoms - reports decreased depression and anxiety. Reports some irritability. Reports one recent panic attack - "feelings of impending doom". Reports worry, rumination and over thinking. Mood is stable. Stating "I feel like I'm doing ok". Feels like the Celexa at 10mg  is helpful. Improved interest and motivation. Seeing therapist. Taking medications as prescribed. Energy levels improved. Active, has a regular exercise routine. Enjoys some usual interests and activities. Student. Dating. Lives with parents when not school. Spending time with family. Appetite adequate. Weight gain - 285 to 290 pounds. Sleeps well most nights. Averages 8 to 9 hours.   Reports focus and concentration difficulties. Completing tasks. Managing aspects of household. 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 Denies SI or HI. Denies AH or VH. Denies self harm. Reports THC use. Denies recent alcohol use.   Seeing therapist.  Previous medication trials: Denies  Review of Systems:  Review of Systems  Musculoskeletal:  Negative for gait problem.  Neurological:  Negative for tremors.  Psychiatric/Behavioral:         Please refer to HPI    Medications: I have reviewed the patient's current medications.  Current Outpatient Medications  Medication Sig Dispense Refill   cetirizine (ZYRTEC) 10 MG tablet Take 10 mg by mouth daily.     citalopram (CELEXA) 10 MG tablet Take 1 tablet (10 mg total) by mouth daily. 90 tablet 3   DiphenhydrAMINE HCl (BENADRYL ALLERGY PO) Take 1 tablet by mouth as needed.     EPINEPHrine (EPIPEN 2-PAK) 0.3 mg/0.3 mL IJ SOAJ injection Use as directed for life-threatening allergic reaction. 4 Device 1   fexofenadine (ALLEGRA) 30 MG tablet Take 30 mg by mouth 2 (two) times daily.     fluticasone (FLONASE) 50 MCG/ACT nasal spray Place 1 spray into the nose. Use one spray in each nostril one to two times daily.     No current facility-administered medications for this visit.    Medication Side Effects: None  Allergies:  Allergies  Allergen Reactions   Amoxicillin Hives    Severity unknown    Past Medical History:  Diagnosis Date   Obesity    Urticaria     Family History  Problem Relation Age of  Onset   Asthma Mother    Allergic rhinitis Mother    Cancer Maternal Grandmother        neuro endocrine carcinoma   Allergic rhinitis Maternal Grandfather    Urticaria Maternal Grandfather     Social History   Socioeconomic History   Marital status: Single    Spouse name: Not on file   Number of children: Not on file   Years of education: Not on file   Highest education level: Not on file  Occupational  History   Not on file  Tobacco Use   Smoking status: Never   Smokeless tobacco: Never  Substance and Sexual Activity   Alcohol use: Not on file   Drug use: Not on file   Sexual activity: Not on file  Other Topics Concern   Not on file  Social History Narrative   Not on file   Social Determinants of Health   Financial Resource Strain: Not on file  Food Insecurity: Not on file  Transportation Needs: Not on file  Physical Activity: Not on file  Stress: Not on file  Social Connections: Not on file  Intimate Partner Violence: Not on file    Past Medical History, Surgical history, Social history, and Family history were reviewed and updated as appropriate.   Please see review of systems for further details on the patient's review from today.   Objective:   Physical Exam:  There were no vitals taken for this visit.  Physical Exam Constitutional:      General: He is not in acute distress. Musculoskeletal:        General: No deformity.  Neurological:     Mental Status: He is alert and oriented to person, place, and time.     Coordination: Coordination normal.  Psychiatric:        Attention and Perception: Attention and perception normal. He does not perceive auditory or visual hallucinations.        Mood and Affect: Mood normal. Mood is not anxious or depressed. Affect is not labile, blunt, angry or inappropriate.        Speech: Speech normal.        Behavior: Behavior normal.        Thought Content: Thought content normal. Thought content is not paranoid or delusional. Thought content does not include homicidal or suicidal ideation. Thought content does not include homicidal or suicidal plan.        Cognition and Memory: Cognition and memory normal.        Judgment: Judgment normal.     Comments: Insight intact     Lab Review:  No results found for: "NA", "K", "CL", "CO2", "GLUCOSE", "BUN", "CREATININE", "CALCIUM", "PROT", "ALBUMIN", "AST", "ALT", "ALKPHOS", "BILITOT",  "GFRNONAA", "GFRAA"  No results found for: "WBC", "RBC", "HGB", "HCT", "PLT", "MCV", "MCH", "MCHC", "RDW", "LYMPHSABS", "MONOABS", "EOSABS", "BASOSABS"  No results found for: "POCLITH", "LITHIUM"   No results found for: "PHENYTOIN", "PHENOBARB", "VALPROATE", "CBMZ"   .res Assessment: Plan:    Plan:  PDMP reviewed  Continue Celexa 10mg  daily - patient working on taking consistently.  Discussed ADD - tested at [facility]  RTC 6 months  Patient advised to contact office with any questions, adverse effects, or acute worsening in signs and symptoms.  Time spent with patient was 15 minutes. Greater than 50% of face to face time with patient was spent on counseling and coordination of care.    There are no diagnoses linked to this encounter.   Please see After  Visit Summary for patient specific instructions.  Future Appointments  Date Time Provider Department Center  08/02/2023  4:40 PM Debi Cousin, Thereasa Solo, NP [facility] None  05/16/2024  5:00 PM Christopherjame Carnell, Thereasa Solo, NP [facility] None    No orders of the defined types were placed in this encounter.     -------------------------------

## 2023-08-08 DIAGNOSIS — F411 Generalized anxiety disorder: Secondary | ICD-10-CM | POA: Diagnosis not present

## 2023-08-31 DIAGNOSIS — F411 Generalized anxiety disorder: Secondary | ICD-10-CM | POA: Diagnosis not present

## 2023-09-02 DIAGNOSIS — R0789 Other chest pain: Secondary | ICD-10-CM | POA: Diagnosis not present

## 2023-09-06 ENCOUNTER — Telehealth: Payer: Self-pay | Admitting: Adult Health

## 2023-09-06 NOTE — Telephone Encounter (Signed)
Mom called and said that her son had a no show fee in September 30th. Notes said that he arrived to late for the appointment. Gina charged a No Show fee and he was in the office. She would like it to be waived. Please let her know at [phone]

## 2023-09-06 NOTE — Telephone Encounter (Signed)
Please see message regarding no show fee.

## 2023-09-09 DIAGNOSIS — F411 Generalized anxiety disorder: Secondary | ICD-10-CM | POA: Diagnosis not present

## 2023-09-19 DIAGNOSIS — F411 Generalized anxiety disorder: Secondary | ICD-10-CM | POA: Diagnosis not present

## 2023-09-20 DIAGNOSIS — Z1152 Encounter for screening for COVID-19: Secondary | ICD-10-CM | POA: Diagnosis not present

## 2023-09-20 DIAGNOSIS — J069 Acute upper respiratory infection, unspecified: Secondary | ICD-10-CM | POA: Diagnosis not present

## 2023-10-03 DIAGNOSIS — F411 Generalized anxiety disorder: Secondary | ICD-10-CM | POA: Diagnosis not present

## 2023-10-21 DIAGNOSIS — F411 Generalized anxiety disorder: Secondary | ICD-10-CM | POA: Diagnosis not present

## 2023-11-04 DIAGNOSIS — F411 Generalized anxiety disorder: Secondary | ICD-10-CM | POA: Diagnosis not present

## 2023-11-10 DIAGNOSIS — F411 Generalized anxiety disorder: Secondary | ICD-10-CM | POA: Diagnosis not present

## 2023-11-25 DIAGNOSIS — F411 Generalized anxiety disorder: Secondary | ICD-10-CM | POA: Diagnosis not present

## 2023-12-06 DIAGNOSIS — F411 Generalized anxiety disorder: Secondary | ICD-10-CM | POA: Diagnosis not present

## 2023-12-16 DIAGNOSIS — F411 Generalized anxiety disorder: Secondary | ICD-10-CM | POA: Diagnosis not present

## 2023-12-23 DIAGNOSIS — F411 Generalized anxiety disorder: Secondary | ICD-10-CM | POA: Diagnosis not present

## 2023-12-30 DIAGNOSIS — F411 Generalized anxiety disorder: Secondary | ICD-10-CM | POA: Diagnosis not present

## 2024-01-13 DIAGNOSIS — F411 Generalized anxiety disorder: Secondary | ICD-10-CM | POA: Diagnosis not present

## 2024-01-20 DIAGNOSIS — F411 Generalized anxiety disorder: Secondary | ICD-10-CM | POA: Diagnosis not present

## 2024-01-27 DIAGNOSIS — F411 Generalized anxiety disorder: Secondary | ICD-10-CM | POA: Diagnosis not present

## 2024-02-03 DIAGNOSIS — F411 Generalized anxiety disorder: Secondary | ICD-10-CM | POA: Diagnosis not present

## 2024-03-01 DIAGNOSIS — M25572 Pain in left ankle and joints of left foot: Secondary | ICD-10-CM | POA: Diagnosis not present

## 2024-03-02 DIAGNOSIS — F411 Generalized anxiety disorder: Secondary | ICD-10-CM | POA: Diagnosis not present

## 2024-03-08 DIAGNOSIS — M25572 Pain in left ankle and joints of left foot: Secondary | ICD-10-CM | POA: Diagnosis not present

## 2024-03-08 DIAGNOSIS — Z136 Encounter for screening for cardiovascular disorders: Secondary | ICD-10-CM | POA: Diagnosis not present

## 2024-03-08 DIAGNOSIS — R5382 Chronic fatigue, unspecified: Secondary | ICD-10-CM | POA: Diagnosis not present

## 2024-03-08 DIAGNOSIS — Z0001 Encounter for general adult medical examination with abnormal findings: Secondary | ICD-10-CM | POA: Diagnosis not present

## 2024-03-08 DIAGNOSIS — S93492A Sprain of other ligament of left ankle, initial encounter: Secondary | ICD-10-CM | POA: Diagnosis not present

## 2024-03-08 DIAGNOSIS — Z01411 Encounter for gynecological examination (general) (routine) with abnormal findings: Secondary | ICD-10-CM | POA: Diagnosis not present

## 2024-03-09 DIAGNOSIS — F411 Generalized anxiety disorder: Secondary | ICD-10-CM | POA: Diagnosis not present

## 2024-03-15 DIAGNOSIS — R079 Chest pain, unspecified: Secondary | ICD-10-CM | POA: Diagnosis not present

## 2024-03-15 DIAGNOSIS — I10 Essential (primary) hypertension: Secondary | ICD-10-CM | POA: Diagnosis not present

## 2024-03-15 DIAGNOSIS — Z880 Allergy status to penicillin: Secondary | ICD-10-CM | POA: Diagnosis not present

## 2024-03-15 DIAGNOSIS — R0602 Shortness of breath: Secondary | ICD-10-CM | POA: Diagnosis not present

## 2024-03-15 DIAGNOSIS — R0789 Other chest pain: Secondary | ICD-10-CM | POA: Diagnosis not present

## 2024-03-16 DIAGNOSIS — F411 Generalized anxiety disorder: Secondary | ICD-10-CM | POA: Diagnosis not present

## 2024-03-23 DIAGNOSIS — F411 Generalized anxiety disorder: Secondary | ICD-10-CM | POA: Diagnosis not present

## 2024-03-30 DIAGNOSIS — F411 Generalized anxiety disorder: Secondary | ICD-10-CM | POA: Diagnosis not present

## 2024-04-05 DIAGNOSIS — I10 Essential (primary) hypertension: Secondary | ICD-10-CM | POA: Diagnosis not present

## 2024-04-05 DIAGNOSIS — R002 Palpitations: Secondary | ICD-10-CM | POA: Diagnosis not present

## 2024-04-05 DIAGNOSIS — R0789 Other chest pain: Secondary | ICD-10-CM | POA: Diagnosis not present

## 2024-04-06 DIAGNOSIS — F411 Generalized anxiety disorder: Secondary | ICD-10-CM | POA: Diagnosis not present

## 2024-04-17 DIAGNOSIS — F411 Generalized anxiety disorder: Secondary | ICD-10-CM | POA: Diagnosis not present

## 2024-04-20 DIAGNOSIS — F411 Generalized anxiety disorder: Secondary | ICD-10-CM | POA: Diagnosis not present

## 2024-04-27 DIAGNOSIS — F411 Generalized anxiety disorder: Secondary | ICD-10-CM | POA: Diagnosis not present

## 2024-05-11 DIAGNOSIS — F411 Generalized anxiety disorder: Secondary | ICD-10-CM | POA: Diagnosis not present

## 2024-05-16 ENCOUNTER — Ambulatory Visit (INDEPENDENT_AMBULATORY_CARE_PROVIDER_SITE_OTHER): Payer: BC Managed Care – PPO | Admitting: Adult Health

## 2024-05-16 DIAGNOSIS — Z0389 Encounter for observation for other suspected diseases and conditions ruled out: Secondary | ICD-10-CM

## 2024-05-16 NOTE — Progress Notes (Signed)
 Patient no show appointment. ? ?

## 2024-05-18 ENCOUNTER — Encounter: Payer: Self-pay | Admitting: Advanced Practice Midwife

## 2024-05-18 DIAGNOSIS — F411 Generalized anxiety disorder: Secondary | ICD-10-CM | POA: Diagnosis not present

## 2024-05-23 DIAGNOSIS — F411 Generalized anxiety disorder: Secondary | ICD-10-CM | POA: Diagnosis not present

## 2024-06-01 DIAGNOSIS — F411 Generalized anxiety disorder: Secondary | ICD-10-CM | POA: Diagnosis not present

## 2024-06-07 DIAGNOSIS — F411 Generalized anxiety disorder: Secondary | ICD-10-CM | POA: Diagnosis not present

## 2024-06-15 DIAGNOSIS — F411 Generalized anxiety disorder: Secondary | ICD-10-CM | POA: Diagnosis not present

## 2024-06-15 DIAGNOSIS — J069 Acute upper respiratory infection, unspecified: Secondary | ICD-10-CM | POA: Diagnosis not present

## 2024-06-22 DIAGNOSIS — F411 Generalized anxiety disorder: Secondary | ICD-10-CM | POA: Diagnosis not present

## 2024-06-29 DIAGNOSIS — F411 Generalized anxiety disorder: Secondary | ICD-10-CM | POA: Diagnosis not present

## 2024-07-06 DIAGNOSIS — F411 Generalized anxiety disorder: Secondary | ICD-10-CM | POA: Diagnosis not present

## 2024-07-13 DIAGNOSIS — F411 Generalized anxiety disorder: Secondary | ICD-10-CM | POA: Diagnosis not present

## 2024-07-16 DIAGNOSIS — F411 Generalized anxiety disorder: Secondary | ICD-10-CM | POA: Diagnosis not present

## 2024-07-27 DIAGNOSIS — F411 Generalized anxiety disorder: Secondary | ICD-10-CM | POA: Diagnosis not present

## 2024-08-03 DIAGNOSIS — F411 Generalized anxiety disorder: Secondary | ICD-10-CM | POA: Diagnosis not present

## 2024-08-17 DIAGNOSIS — F411 Generalized anxiety disorder: Secondary | ICD-10-CM | POA: Diagnosis not present

## 2024-08-24 DIAGNOSIS — F411 Generalized anxiety disorder: Secondary | ICD-10-CM | POA: Diagnosis not present

## 2024-08-31 DIAGNOSIS — F411 Generalized anxiety disorder: Secondary | ICD-10-CM | POA: Diagnosis not present

## 2024-09-21 DIAGNOSIS — F411 Generalized anxiety disorder: Secondary | ICD-10-CM | POA: Diagnosis not present

## 2024-10-05 DIAGNOSIS — F411 Generalized anxiety disorder: Secondary | ICD-10-CM | POA: Diagnosis not present

## 2024-10-19 DIAGNOSIS — F411 Generalized anxiety disorder: Secondary | ICD-10-CM | POA: Diagnosis not present

## 2024-10-30 DIAGNOSIS — F411 Generalized anxiety disorder: Secondary | ICD-10-CM | POA: Diagnosis not present
